# Patient Record
Sex: Male | Born: 1990 | Race: White | Hispanic: No | Marital: Single | Smoking: Former smoker
Health system: Southern US, Community
[De-identification: ages and names within clinical notes are randomized; demographics above are authoritative.]

---
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
## 2015-07-25 ENCOUNTER — Emergency Department (HOSPITAL_COMMUNITY): Payer: BLUE CROSS/BLUE SHIELD

## 2015-07-25 ENCOUNTER — Encounter (HOSPITAL_COMMUNITY): Payer: Self-pay | Admitting: *Deleted

## 2015-07-25 ENCOUNTER — Emergency Department (HOSPITAL_COMMUNITY)
Admission: EM | Admit: 2015-07-25 | Discharge: 2015-07-25 | Disposition: A | Discharge status: Home / Self Care | Payer: BLUE CROSS/BLUE SHIELD | Attending: Emergency Medicine | Admitting: Emergency Medicine

## 2015-07-25 DIAGNOSIS — Y9389 Activity, other specified: Secondary | ICD-10-CM | POA: Insufficient documentation

## 2015-07-25 DIAGNOSIS — S299XXA Unspecified injury of thorax, initial encounter: Secondary | ICD-10-CM | POA: Insufficient documentation

## 2015-07-25 DIAGNOSIS — S3992XA Unspecified injury of lower back, initial encounter: Secondary | ICD-10-CM | POA: Diagnosis not present

## 2015-07-25 DIAGNOSIS — S3991XA Unspecified injury of abdomen, initial encounter: Secondary | ICD-10-CM | POA: Insufficient documentation

## 2015-07-25 DIAGNOSIS — F17200 Nicotine dependence, unspecified, uncomplicated: Secondary | ICD-10-CM | POA: Insufficient documentation

## 2015-07-25 DIAGNOSIS — S99911A Unspecified injury of right ankle, initial encounter: Secondary | ICD-10-CM | POA: Insufficient documentation

## 2015-07-25 DIAGNOSIS — Y998 Other external cause status: Secondary | ICD-10-CM | POA: Insufficient documentation

## 2015-07-25 DIAGNOSIS — Y92410 Unspecified street and highway as the place of occurrence of the external cause: Secondary | ICD-10-CM | POA: Diagnosis not present

## 2015-07-25 DIAGNOSIS — F419 Anxiety disorder, unspecified: Secondary | ICD-10-CM | POA: Diagnosis not present

## 2015-07-25 DIAGNOSIS — M25571 Pain in right ankle and joints of right foot: Secondary | ICD-10-CM

## 2015-07-25 DIAGNOSIS — R000 Tachycardia, unspecified: Secondary | ICD-10-CM | POA: Diagnosis not present

## 2015-07-25 DIAGNOSIS — S79911A Unspecified injury of right hip, initial encounter: Secondary | ICD-10-CM | POA: Insufficient documentation

## 2015-07-25 DIAGNOSIS — S8991XA Unspecified injury of right lower leg, initial encounter: Secondary | ICD-10-CM | POA: Diagnosis not present

## 2015-07-25 DIAGNOSIS — M25561 Pain in right knee: Secondary | ICD-10-CM

## 2015-07-25 LAB — I-STAT CHEM 8, ED
BUN: 6 mg/dL (ref 6–20)
CALCIUM ION: 1.16 mmol/L (ref 1.12–1.23)
Chloride: 106 mmol/L (ref 101–111)
Creatinine, Ser: 1 mg/dL (ref 0.61–1.24)
Glucose, Bld: 89 mg/dL (ref 65–99)
HCT: 48 % (ref 39.0–52.0)
Hemoglobin: 16.3 g/dL (ref 13.0–17.0)
Potassium: 3.8 mmol/L (ref 3.5–5.1)
Sodium: 144 mmol/L (ref 135–145)
TCO2: 25 mmol/L (ref 0–100)

## 2015-07-25 MED ORDER — IOHEXOL 300 MG/ML  SOLN
100.0000 mL | Freq: Once | INTRAMUSCULAR | Status: AC | PRN
  Administered 2015-07-25: 100 mL via INTRAVENOUS

## 2015-07-25 MED ORDER — ONDANSETRON HCL 4 MG/2ML IJ SOLN
4.0000 mg | Freq: Once | INTRAMUSCULAR | Status: AC
  Administered 2015-07-25: 4 mg via INTRAVENOUS
  Filled 2015-07-25: qty 2

## 2015-07-25 MED ORDER — IBUPROFEN 800 MG PO TABS: 800.0000 mg | ORAL_TABLET | Freq: Three times a day (TID) | ORAL | Status: AC | PRN

## 2015-07-25 MED ORDER — MORPHINE SULFATE (PF) 4 MG/ML IV SOLN
4.0000 mg | Freq: Once | INTRAVENOUS | Status: AC
  Administered 2015-07-25: 4 mg via INTRAVENOUS
  Filled 2015-07-25: qty 1

## 2015-07-25 MED ORDER — HYDROCODONE-ACETAMINOPHEN 5-325 MG PO TABS: 1.0000 | ORAL_TABLET | Freq: Four times a day (QID) | ORAL | Status: DC | PRN

## 2015-07-25 MED ORDER — SODIUM CHLORIDE 0.9 % IV BOLUS (SEPSIS)
1000.0000 mL | Freq: Once | INTRAVENOUS | Status: AC
  Administered 2015-07-25: 1000 mL via INTRAVENOUS
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
## 2019-01-14 ENCOUNTER — Emergency Department (HOSPITAL_BASED_OUTPATIENT_CLINIC_OR_DEPARTMENT_OTHER)
Admission: EM | Admit: 2019-01-14 | Discharge: 2019-01-14 | Disposition: A | Discharge status: Home / Self Care | Payer: BC Managed Care – PPO | Attending: Emergency Medicine | Admitting: Emergency Medicine

## 2019-01-14 ENCOUNTER — Emergency Department (HOSPITAL_BASED_OUTPATIENT_CLINIC_OR_DEPARTMENT_OTHER): Payer: BC Managed Care – PPO

## 2019-01-14 ENCOUNTER — Encounter (HOSPITAL_BASED_OUTPATIENT_CLINIC_OR_DEPARTMENT_OTHER): Payer: Self-pay | Admitting: Emergency Medicine

## 2019-01-14 ENCOUNTER — Other Ambulatory Visit: Payer: Self-pay

## 2019-01-14 DIAGNOSIS — R002 Palpitations: Secondary | ICD-10-CM | POA: Diagnosis not present

## 2019-01-14 DIAGNOSIS — F419 Anxiety disorder, unspecified: Secondary | ICD-10-CM | POA: Insufficient documentation

## 2019-01-14 DIAGNOSIS — R0602 Shortness of breath: Secondary | ICD-10-CM | POA: Diagnosis not present

## 2019-01-14 DIAGNOSIS — Z87891 Personal history of nicotine dependence: Secondary | ICD-10-CM | POA: Insufficient documentation

## 2019-01-14 DIAGNOSIS — R079 Chest pain, unspecified: Secondary | ICD-10-CM | POA: Insufficient documentation

## 2019-01-14 LAB — CBC WITH DIFFERENTIAL/PLATELET
Abs Immature Granulocytes: 0.01 10*3/uL (ref 0.00–0.07)
Basophils Absolute: 0 10*3/uL (ref 0.0–0.1)
Basophils Relative: 0 %
Eosinophils Absolute: 0 10*3/uL (ref 0.0–0.5)
Eosinophils Relative: 1 %
HCT: 44.6 % (ref 39.0–52.0)
Hemoglobin: 15 g/dL (ref 13.0–17.0)
Immature Granulocytes: 0 %
Lymphocytes Relative: 17 %
Lymphs Abs: 0.9 10*3/uL (ref 0.7–4.0)
MCH: 31.8 pg (ref 26.0–34.0)
MCHC: 33.6 g/dL (ref 30.0–36.0)
MCV: 94.5 fL (ref 80.0–100.0)
Monocytes Absolute: 0.5 10*3/uL (ref 0.1–1.0)
Monocytes Relative: 11 %
Neutro Abs: 3.5 10*3/uL (ref 1.7–7.7)
Neutrophils Relative %: 71 %
Platelets: 153 10*3/uL (ref 150–400)
RBC: 4.72 MIL/uL (ref 4.22–5.81)
RDW: 12.4 % (ref 11.5–15.5)
WBC: 4.9 10*3/uL (ref 4.0–10.5)
nRBC: 0 % (ref 0.0–0.2)

## 2019-01-14 LAB — BASIC METABOLIC PANEL
Anion gap: 7 (ref 5–15)
BUN: 6 mg/dL (ref 6–20)
CO2: 24 mmol/L (ref 22–32)
Calcium: 8.9 mg/dL (ref 8.9–10.3)
Chloride: 108 mmol/L (ref 98–111)
Creatinine, Ser: 0.81 mg/dL (ref 0.61–1.24)
GFR calc Af Amer: >60 mL/min (ref >60)
GFR calc non Af Amer: >60 mL/min (ref >60)
Glucose, Bld: 97 mg/dL (ref 70–99)
Potassium: 3.9 mmol/L (ref 3.5–5.1)
Sodium: 139 mmol/L (ref 135–145)

## 2019-01-14 LAB — MAGNESIUM: Magnesium: 2.1 mg/dL (ref 1.7–2.4)

## 2019-01-14 MED ORDER — HYDROXYZINE HCL 25 MG PO TABS
50.0000 mg | ORAL_TABLET | Freq: Once | ORAL | Status: AC
  Administered 2019-01-14: 50 mg via ORAL
  Filled 2019-01-14: qty 2

## 2019-01-14 MED ORDER — HYDROXYZINE HCL 25 MG PO TABS: 25.0000 mg | ORAL_TABLET | Freq: Four times a day (QID) | ORAL | 0 refills | Status: AC | PRN

## 2019-01-14 MED FILL — hydrOXYzine HCL 25 MG TABS: 25 | 7 days supply | Qty: 28 | Fill #0

## 2019-01-14 NOTE — ED Notes (Signed)
Pt requesting to leave at this time. EDP made aware.

## 2019-01-14 NOTE — Discharge Instructions (Signed)
Please see the information and instructions below regarding your visit.  Your diagnoses today include:  1. Central chest pain   2. Palpitations     Tests performed today include: See side panel of your discharge paperwork for testing performed today. Vital signs are listed at the bottom of these instructions.   Your work-up is very reassuring today.  Your lab work is entirely normal.  Your EKG is in normal rhythm without any electrical abnormalities.  Your chest x-ray shows no abnormalities with the heart or lungs.  Medications prescribed:    Take any prescribed medications only as prescribed, and any over the counter medications only as directed on the packaging.  You are prescribed a medication called hydroxyzine.  This is a similar medication to Benadryl.  Please do not drive, drink alcohol or operate machinery taking this medication until you know how this will affect you.  Home care instructions:  Please follow any educational materials contained in this packet.   Follow-up instructions: Please follow-up with your primary care provider in one week for further evaluation of your symptoms if they are not completely improved.   Please call [facility], a psychiatrist.   Return instructions:  Please return to the Emergency Department if you experience worsening symptoms.  Return for any worsening pain, shortness of breath, or failure treatment pass out. Please return if you have any other emergent concerns.  Additional Information:   Your vital signs today were: BP 119/83    Pulse (!) 51    Temp 98.6 F (37 C) (Oral)    Resp 17    Ht 5\' 10"  (1.778 m)    Wt 59.9 kg    SpO2 100%    BMI 18.94 kg/m  If your blood pressure (BP) was elevated on multiple readings during this visit above 130 for the top number or above 80 for the bottom number, please have this repeated by your primary care provider within one month. --------------  Thank you for allowing [place] to participate in  your care today.

## 2019-01-14 NOTE — ED Triage Notes (Signed)
Pt c/o CP episodes that have been going on for years; sts comes out of nowhere, heart is racing and he finds it difficult to breathe

## 2019-01-14 NOTE — ED Provider Notes (Signed)
[facility] EMERGENCY DEPARTMENT Provider Note   CSN: [id number] Arrival date & time: 01/14/19  1246    History   Chief Complaint Chief Complaint  Patient presents with  . Chest Pain    HPI Gabriel York is a 28 y.o. male.     HPI   Patient is a 28 year old male with no significant past medical history presenting for central chest pain, palpitations, episodic shortness of breath.  Patient reports this is been occurring intermittently for approximately 3 years.  He states that he will have episodes nearly every day.  Today he felt the episode was particularly a little bit worse.  Patient ports that most of the time he experiences this when he is in a large crowd setting.  He reports that he will feel anxious and then others will look at him and ask if he is okay and this will precipitate the shortness of breath, palpitations and chest pain.  Patient reports he has never been officially diagnosed with anxiety.  He reports that his family has tried to calm him down on several occasions and he even had a cardiovascular work-up a couple years ago with an echocardiogram and EKG at an outside office which was normal.  Patient denies any history of DVT/PE, hormone use, cancer treatment, recent mobilization, hospitalization, recent surgery, hemoptysis, lower extremity edema or calf tenderness.  He denies any recent respiratory infections.  History reviewed. No pertinent past medical history.  There are no active problems to display for this patient.   History reviewed. No pertinent surgical history.      Home Medications    Prior to Admission medications   Medication Sig Start Date End Date Taking? Authorizing Provider  hydrOXYzine (ATARAX/VISTARIL) 25 MG tablet Take 1 tablet (25 mg total) by mouth every 6 (six) hours as needed for up to 7 days for anxiety. 01/14/19 01/21/19  Aviva KluverMurray, Reannon Candella B, PA-C  ibuprofen (ADVIL,MOTRIN) 800 MG tablet Take 1 tablet (800 mg total) by mouth  every 8 (eight) hours as needed for mild pain. 07/25/15   Ward, Layla MawKristen N, DO    Family History No family history on file.  Social History Social History   Tobacco Use  . Smoking status: Former Smoker    Packs/day: 0.50  . Smokeless tobacco: Never Used  Substance Use Topics  . Alcohol use: Yes    Comment: occ  . Drug use: No     Allergies   Patient has no known allergies.   Review of Systems Review of Systems  Constitutional: Negative for chills and fever.  HENT: Negative for congestion, rhinorrhea and sinus pain.   Respiratory: Positive for chest tightness and shortness of breath. Negative for wheezing and stridor.   Cardiovascular: Positive for palpitations.  Gastrointestinal: Negative for abdominal pain, nausea and vomiting.  Neurological: Negative for syncope, weakness and light-headedness.  All other systems reviewed and are negative.    Physical Exam Updated Vital Signs BP 119/83   Pulse (!) 51   Temp 98.6 F (37 C) (Oral)   Resp 17   Ht 5\' 10"  (1.778 m)   Wt 59.9 kg   SpO2 100%   BMI 18.94 kg/m   Physical Exam Vitals signs and nursing note reviewed.  Constitutional:      General: He is not in acute distress.    Appearance: He is well-developed. He is not ill-appearing or diaphoretic.  HENT:     Head: Normocephalic and atraumatic.  Eyes:     Conjunctiva/sclera: Conjunctivae  normal.     Pupils: Pupils are equal, round, and reactive to light.  Neck:     Musculoskeletal: Normal range of motion and neck supple.  Cardiovascular:     Rate and Rhythm: Normal rate and regular rhythm.     Pulses:          Radial pulses are 2+ on the right side and 2+ on the left side.       Dorsalis pedis pulses are 2+ on the right side and 2+ on the left side.     Heart sounds: S1 normal and S2 normal. No murmur.     Comments: No lower extremity edema.  No calf tenderness. Pulmonary:     Effort: Pulmonary effort is normal.     Breath sounds: Normal breath sounds. No  wheezing or rales.  Abdominal:     General: There is no distension.     Palpations: Abdomen is soft.     Tenderness: There is no abdominal tenderness. There is no guarding.  Musculoskeletal: Normal range of motion.        General: No deformity.  Lymphadenopathy:     Cervical: No cervical adenopathy.  Skin:    General: Skin is warm and dry.     Findings: No erythema or rash.     Comments: Skin of chest sunburned.   Neurological:     Mental Status: He is alert.     Comments: Cranial nerves grossly intact. Patient moves extremities symmetrically and with good coordination.  Psychiatric:        Behavior: Behavior normal.        Thought Content: Thought content normal.        Judgment: Judgment normal.      ED Treatments / Results  Labs (all labs ordered are listed, but only abnormal results are displayed) Labs Reviewed  BASIC METABOLIC PANEL  CBC WITH DIFFERENTIAL/PLATELET  MAGNESIUM    EKG EKG Interpretation  Date/Time:  Tuesday January 14 2019 13:01:50 EDT Ventricular Rate:  66 PR Interval:    QRS Duration: 92 QT Interval:  414 QTC Calculation: 434 R Axis:   79 Text Interpretation:  Sinus arrhythmia Otherwise within normal limits No previous tracing Confirmed by Blanchie Dessert [phone]) on 01/14/2019 2:14:58 PM   Radiology Dg Chest Portable 1 View  Result Date: 01/14/2019 CLINICAL DATA:  Chest pain EXAM: PORTABLE CHEST 1 VIEW COMPARISON:  07/25/2015 FINDINGS: The heart size and mediastinal contours are within normal limits. Both lungs are clear. The visualized skeletal structures are unremarkable. IMPRESSION: No acute abnormality of the lungs in AP portable projection. Electronically Signed   By: Eddie Candle M.D.   On: 01/14/2019 14:29    Procedures Procedures (including critical care time)  Medications Ordered in ED Medications  hydrOXYzine (ATARAX/VISTARIL) tablet 50 mg (50 mg Oral Given 01/14/19 1419)     Initial Impression / Assessment and Plan / ED Course   I have reviewed the triage vital signs and the nursing notes.  Pertinent labs & imaging results that were available during my care of the patient were reviewed by me and considered in my medical decision making (see chart for details).        This is a well-appearing 28 year old male with no significant past medical history presenting for episodic palpitations, chest pain, and shortness of breath.  Differential diagnosis includes sinus tachycardia, SVT, pericarditis, chest wall pain, anxiety.  Do not suspect ACS.  Low suspicion for pulmonary embolism.  Given the long course of symptoms, episodic  nature, and patient's linkage of symptoms to experiences of anxiety, have a higher suspicion that these are episodes of panic.  Patient reports his symptoms are improved here in the hospital, and did improve with hydroxyzine.  Work-up thus far negative for any ischemia, infarction, or arrhythmia on EKG, reviewed by me.  Chest x-ray demonstrates no cardiopulmonary disease and no mediastinal adenopathy.  CBC and BMP are without acute abnormalities.  Magnesium is normal.  Counseled patient on reassuring results.  He is already had a cardiovascular work-up which was reassuring.  Patient instructed to follow-up with a primary care provider and he was also given resources for behavioral health.  Return precautions given for any new or worsening symptoms.  Patient is in understanding and agrees with the plan of care.  Final Clinical Impressions(s) / ED Diagnoses   Final diagnoses:  Central chest pain  Palpitations    ED Discharge Orders         Ordered    hydrOXYzine (ATARAX/VISTARIL) 25 MG tablet  Every 6 hours PRN     01/14/19 1534           Elisha PonderMurray, Cornelius Marullo B, PA-C 01/14/19 1547    Gwyneth SproutPlunkett, Whitney, MD 01/17/19 2134

## 2019-01-14 NOTE — ED Notes (Signed)
HR noted to range between 49 - 91, staying mostly in the 70s, during triage. Pt appears anxious, but not in distress. While pt is lying quietly w/o staff in room, HR is NSR at 79 on monitor.

## 2019-11-05 IMAGING — DX PORTABLE CHEST - 1 VIEW
1 series · 1 of 1 positions shown · non-contrast
Comparison: 07/25/2015

CLINICAL DATA: Chest pain

EXAM:
PORTABLE CHEST 1 VIEW

[chest ap]
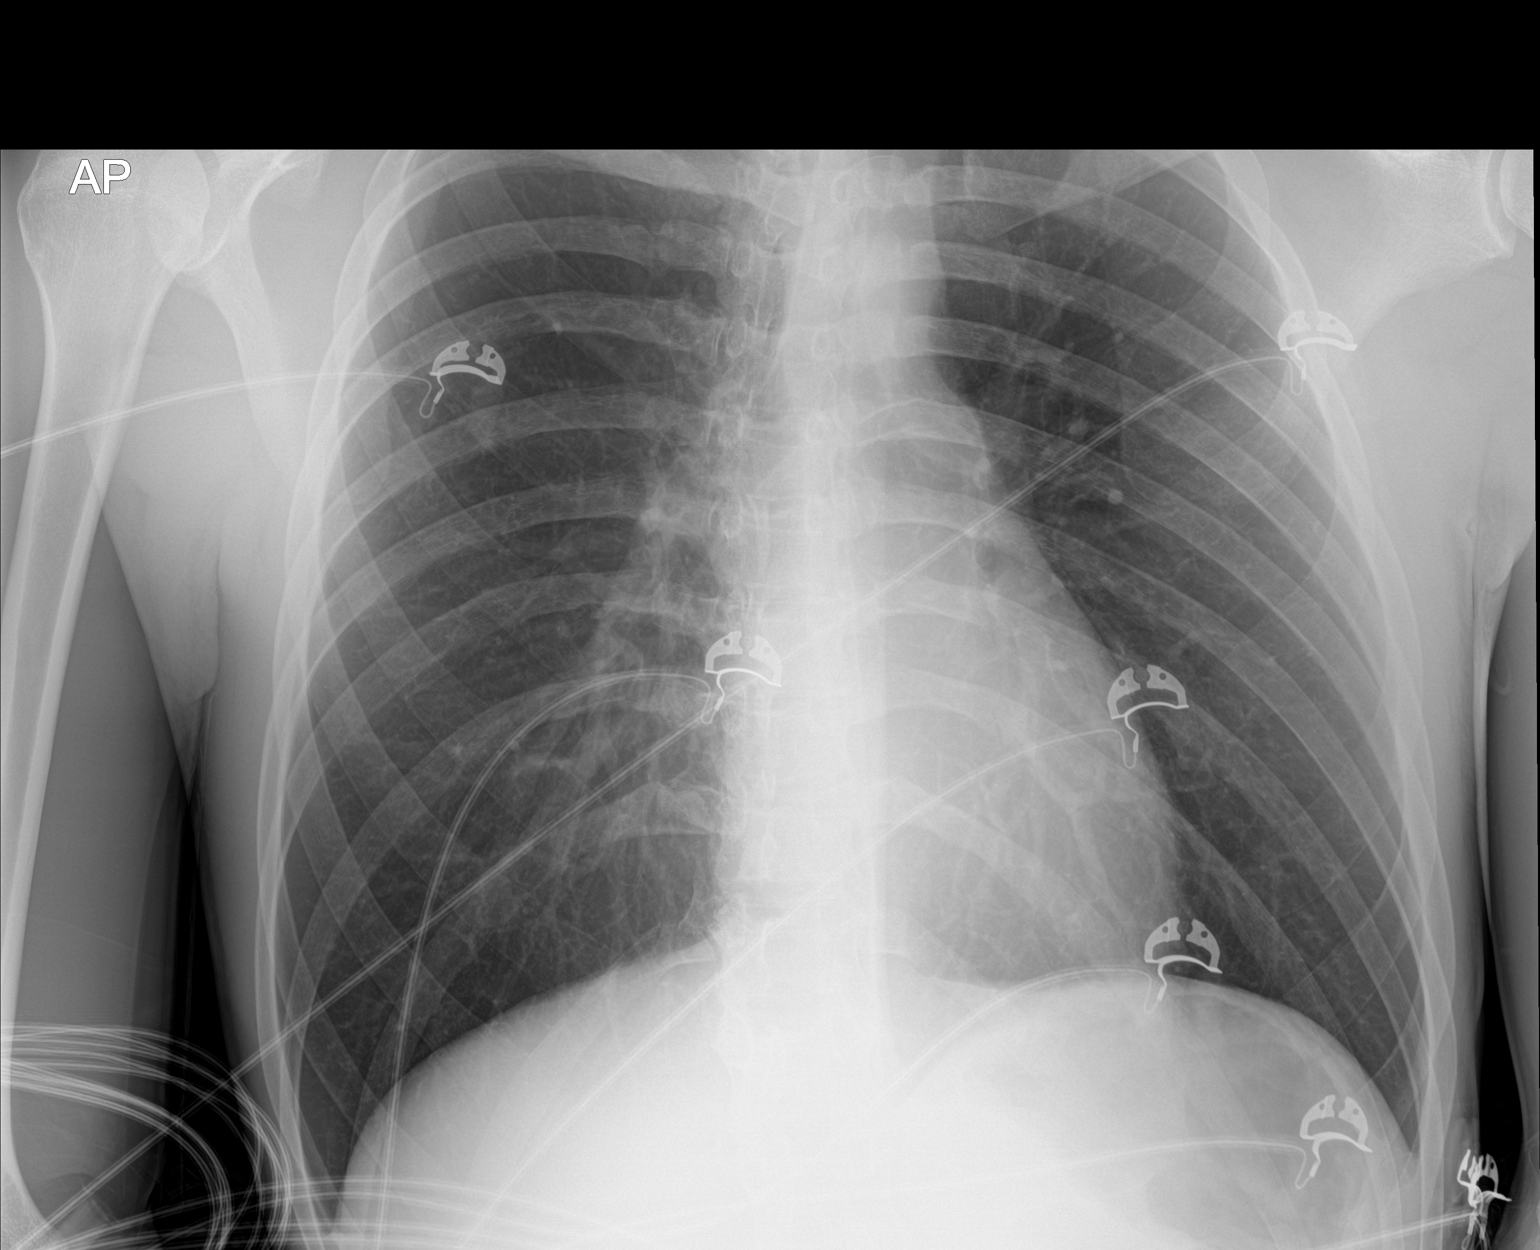

[1 of 1 positions shown; findings below may reference images not displayed]

FINDINGS: The heart size and mediastinal contours are within normal limits.
Both lungs are clear. The visualized skeletal structures are
unremarkable.
IMPRESSION: No acute abnormality of the lungs in AP portable projection.
